# Patient Record
Sex: Female | Born: 1995 | Race: Black or African American | Hispanic: No | Smoking: Never smoker
Health system: Southern US, Community
[De-identification: ages and names within clinical notes are randomized; demographics above are authoritative.]

## PROBLEM LIST (undated history)

## (undated) DIAGNOSIS — F32A Depression, unspecified: Secondary | ICD-10-CM

## (undated) DIAGNOSIS — F329 Major depressive disorder, single episode, unspecified: Secondary | ICD-10-CM

## (undated) HISTORY — DX: Depression, unspecified: F32.A

## (undated) HISTORY — PX: ANTERIOR CRUCIATE LIGAMENT REPAIR: SHX115

## (undated) HISTORY — DX: Major depressive disorder, single episode, unspecified: F32.9

---

## 2016-04-11 NOTE — L&D Delivery Note (Signed)
Delivery Note At 3:18 PM a viable female "Selena Walsh"  was delivered via Vaginal, Spontaneous Delivery (Presentation:OA). Compound presentation of the right arm.  APGAR: 9, 9; weight 7 lb 4.6 oz (3305 g).   Placenta status: intact, spontaneous  Cord:#VC  with the following complications: .  Cord pH: n/a   Anesthesia:  Epidural Episiotomy: None Lacerations: 1st degree;Perineal-repaired, Bilateral labial tears, right extending into the vagina-repaired Suture Repair: 3.0 chromic Est. Blood Loss (mL): 400  During the repair, pt continued to bleed moderately.  Uterus was boggy per RN.  Methergine 0.2 mg IM ordered. I/O of bladder performed with sterile technique.  100-150 ml obtained.  Bleeding adequately controlled.  Mom to postpartum.  Baby to Couplet care / Skin to Skin.  Geryl RankinsVARNADO, Selena Walsh 08/13/2016, 10:39 AM

## 2016-05-13 ENCOUNTER — Other Ambulatory Visit: Payer: Self-pay | Admitting: Obstetrics and Gynecology

## 2016-05-13 DIAGNOSIS — Z3A29 29 weeks gestation of pregnancy: Secondary | ICD-10-CM

## 2016-05-13 DIAGNOSIS — Z3689 Encounter for other specified antenatal screening: Secondary | ICD-10-CM

## 2016-05-13 DIAGNOSIS — O0933 Supervision of pregnancy with insufficient antenatal care, third trimester: Secondary | ICD-10-CM

## 2016-05-13 LAB — OB RESULTS CONSOLE HIV ANTIBODY (ROUTINE TESTING): HIV: NONREACTIVE

## 2016-05-13 LAB — OB RESULTS CONSOLE ABO/RH: RH TYPE: POSITIVE

## 2016-05-13 LAB — OB RESULTS CONSOLE ANTIBODY SCREEN: Antibody Screen: NEGATIVE

## 2016-05-13 LAB — OB RESULTS CONSOLE RUBELLA ANTIBODY, IGM: Rubella: IMMUNE

## 2016-05-13 LAB — OB RESULTS CONSOLE RPR: RPR: NONREACTIVE

## 2016-05-13 LAB — OB RESULTS CONSOLE HEPATITIS B SURFACE ANTIGEN: HEP B S AG: NEGATIVE

## 2016-05-17 ENCOUNTER — Other Ambulatory Visit: Payer: Self-pay | Admitting: Obstetrics and Gynecology

## 2016-05-17 ENCOUNTER — Other Ambulatory Visit (HOSPITAL_COMMUNITY)
Admission: RE | Admit: 2016-05-17 | Discharge: 2016-05-17 | Disposition: A | Discharge status: Home / Self Care | Payer: BLUE CROSS/BLUE SHIELD | Source: Ambulatory Visit | Attending: Obstetrics and Gynecology | Admitting: Obstetrics and Gynecology

## 2016-05-17 DIAGNOSIS — Z01419 Encounter for gynecological examination (general) (routine) without abnormal findings: Secondary | ICD-10-CM | POA: Insufficient documentation

## 2016-05-17 LAB — OB RESULTS CONSOLE GC/CHLAMYDIA
Chlamydia: NEGATIVE
Gonorrhea: NEGATIVE

## 2016-05-18 LAB — CYTOLOGY - PAP: DIAGNOSIS: NEGATIVE

## 2016-05-20 ENCOUNTER — Ambulatory Visit (HOSPITAL_COMMUNITY)
Admission: RE | Admit: 2016-05-20 | Discharge: 2016-05-20 | Disposition: A | Discharge status: Home / Self Care | Payer: BLUE CROSS/BLUE SHIELD | Source: Ambulatory Visit | Attending: Obstetrics and Gynecology | Admitting: Obstetrics and Gynecology

## 2016-05-20 ENCOUNTER — Other Ambulatory Visit: Payer: Self-pay | Admitting: Obstetrics and Gynecology

## 2016-05-20 DIAGNOSIS — Z3A29 29 weeks gestation of pregnancy: Secondary | ICD-10-CM

## 2016-05-20 DIAGNOSIS — Z363 Encounter for antenatal screening for malformations: Secondary | ICD-10-CM | POA: Diagnosis present

## 2016-05-20 DIAGNOSIS — O0933 Supervision of pregnancy with insufficient antenatal care, third trimester: Secondary | ICD-10-CM

## 2016-05-20 DIAGNOSIS — Z3689 Encounter for other specified antenatal screening: Secondary | ICD-10-CM

## 2016-05-20 LAB — OB RESULTS CONSOLE GBS: GBS: POSITIVE

## 2016-06-08 ENCOUNTER — Other Ambulatory Visit: Payer: Self-pay | Admitting: Obstetrics and Gynecology

## 2016-06-08 DIAGNOSIS — Z3A35 35 weeks gestation of pregnancy: Secondary | ICD-10-CM

## 2016-06-08 DIAGNOSIS — O26843 Uterine size-date discrepancy, third trimester: Secondary | ICD-10-CM

## 2016-06-13 ENCOUNTER — Ambulatory Visit (HOSPITAL_COMMUNITY)
Admission: RE | Admit: 2016-06-13 | Discharge: 2016-06-13 | Disposition: A | Discharge status: Home / Self Care | Payer: BLUE CROSS/BLUE SHIELD | Source: Ambulatory Visit | Attending: Obstetrics and Gynecology | Admitting: Obstetrics and Gynecology

## 2016-06-13 DIAGNOSIS — Z3687 Encounter for antenatal screening for uncertain dates: Secondary | ICD-10-CM | POA: Insufficient documentation

## 2016-06-13 DIAGNOSIS — Z3A32 32 weeks gestation of pregnancy: Secondary | ICD-10-CM | POA: Insufficient documentation

## 2016-06-13 DIAGNOSIS — Z3A35 35 weeks gestation of pregnancy: Secondary | ICD-10-CM

## 2016-06-13 DIAGNOSIS — Z362 Encounter for other antenatal screening follow-up: Secondary | ICD-10-CM | POA: Diagnosis present

## 2016-06-13 DIAGNOSIS — O26843 Uterine size-date discrepancy, third trimester: Secondary | ICD-10-CM

## 2016-08-09 ENCOUNTER — Encounter (HOSPITAL_COMMUNITY): Payer: Self-pay | Admitting: *Deleted

## 2016-08-09 ENCOUNTER — Telehealth (HOSPITAL_COMMUNITY): Payer: Self-pay | Admitting: *Deleted

## 2016-08-09 NOTE — Telephone Encounter (Signed)
Preadmission screen  

## 2016-08-11 ENCOUNTER — Other Ambulatory Visit: Payer: Self-pay | Admitting: Obstetrics and Gynecology

## 2016-08-12 ENCOUNTER — Inpatient Hospital Stay (HOSPITAL_COMMUNITY)
Admission: RE | Admit: 2016-08-12 | Discharge: 2016-08-14 | DRG: 774 | Disposition: A | Discharge status: Home / Self Care | Payer: BLUE CROSS/BLUE SHIELD | Source: Ambulatory Visit | Attending: Obstetrics and Gynecology | Admitting: Obstetrics and Gynecology

## 2016-08-12 ENCOUNTER — Inpatient Hospital Stay (HOSPITAL_COMMUNITY): Payer: BLUE CROSS/BLUE SHIELD | Admitting: Anesthesiology

## 2016-08-12 ENCOUNTER — Encounter (HOSPITAL_COMMUNITY): Payer: Self-pay

## 2016-08-12 DIAGNOSIS — O326XX0 Maternal care for compound presentation, not applicable or unspecified: Secondary | ICD-10-CM | POA: Diagnosis present

## 2016-08-12 DIAGNOSIS — F329 Major depressive disorder, single episode, unspecified: Secondary | ICD-10-CM | POA: Diagnosis present

## 2016-08-12 DIAGNOSIS — O480 Post-term pregnancy: Principal | ICD-10-CM | POA: Diagnosis present

## 2016-08-12 DIAGNOSIS — O135 Gestational [pregnancy-induced] hypertension without significant proteinuria, complicating the puerperium: Secondary | ICD-10-CM | POA: Diagnosis present

## 2016-08-12 DIAGNOSIS — O99344 Other mental disorders complicating childbirth: Secondary | ICD-10-CM | POA: Diagnosis present

## 2016-08-12 DIAGNOSIS — O99824 Streptococcus B carrier state complicating childbirth: Secondary | ICD-10-CM | POA: Diagnosis present

## 2016-08-12 DIAGNOSIS — Z8249 Family history of ischemic heart disease and other diseases of the circulatory system: Secondary | ICD-10-CM

## 2016-08-12 DIAGNOSIS — Z3A40 40 weeks gestation of pregnancy: Secondary | ICD-10-CM | POA: Diagnosis not present

## 2016-08-12 DIAGNOSIS — O0993 Supervision of high risk pregnancy, unspecified, third trimester: Secondary | ICD-10-CM

## 2016-08-12 LAB — CBC
HCT: 32.9 % — ABNORMAL LOW (ref 36.0–46.0)
HEMATOCRIT: 35.4 % — AB (ref 36.0–46.0)
HEMOGLOBIN: 11.4 g/dL — AB (ref 12.0–15.0)
Hemoglobin: 12.2 g/dL (ref 12.0–15.0)
MCH: 29.8 pg (ref 26.0–34.0)
MCH: 29.6 pg (ref 26.0–34.0)
MCHC: 34.7 g/dL (ref 30.0–36.0)
MCHC: 34.5 g/dL (ref 30.0–36.0)
MCV: 85.9 fL (ref 78.0–100.0)
MCV: 85.9 fL (ref 78.0–100.0)
Platelets: 285 10*3/uL (ref 150–400)
Platelets: 273 10*3/uL (ref 150–400)
RBC: 3.83 MIL/uL — AB (ref 3.87–5.11)
RBC: 4.12 MIL/uL (ref 3.87–5.11)
RDW: 13.5 % (ref 11.5–15.5)
RDW: 13.5 % (ref 11.5–15.5)
WBC: 9.8 10*3/uL (ref 4.0–10.5)
WBC: 10.2 10*3/uL (ref 4.0–10.5)

## 2016-08-12 LAB — COMPREHENSIVE METABOLIC PANEL
ALBUMIN: 3.1 g/dL — AB (ref 3.5–5.0)
ALT: 13 U/L — ABNORMAL LOW (ref 14–54)
ANION GAP: 10 (ref 5–15)
AST: 22 U/L (ref 15–41)
Alkaline Phosphatase: 146 U/L — ABNORMAL HIGH (ref 38–126)
BILIRUBIN TOTAL: 0.5 mg/dL (ref 0.3–1.2)
BUN: 6 mg/dL (ref 6–20)
CALCIUM: 9.1 mg/dL (ref 8.9–10.3)
CO2: 21 mmol/L — ABNORMAL LOW (ref 22–32)
Chloride: 104 mmol/L (ref 101–111)
Creatinine, Ser: 0.55 mg/dL (ref 0.44–1.00)
Glucose, Bld: 68 mg/dL (ref 65–99)
POTASSIUM: 3.6 mmol/L (ref 3.5–5.1)
Sodium: 135 mmol/L (ref 135–145)
TOTAL PROTEIN: 7.1 g/dL (ref 6.5–8.1)

## 2016-08-12 LAB — PROTEIN / CREATININE RATIO, URINE
CREATININE, URINE: 30 mg/dL
PROTEIN CREATININE RATIO: 0.23 mg/mg{creat} — AB (ref 0.00–0.15)
TOTAL PROTEIN, URINE: 7 mg/dL

## 2016-08-12 LAB — TYPE AND SCREEN
ABO/RH(D): B POS
Antibody Screen: NEGATIVE

## 2016-08-12 LAB — ABO/RH: ABO/RH(D): B POS

## 2016-08-12 LAB — RPR: RPR: NONREACTIVE

## 2016-08-12 MED ORDER — METHYLERGONOVINE MALEATE 0.2 MG PO TABS: 0.2000 mg | ORAL_TABLET | ORAL | Status: DC | PRN

## 2016-08-12 MED ORDER — METHYLERGONOVINE MALEATE 0.2 MG/ML IJ SOLN
0.2000 mg | Freq: Once | INTRAMUSCULAR | Status: AC
  Administered 2016-08-12: 0.2 mg via INTRAMUSCULAR

## 2016-08-12 MED ORDER — LIDOCAINE HCL (PF) 1 % IJ SOLN
30.0000 mL | INTRAMUSCULAR | Status: DC | PRN
  Filled 2016-08-12: qty 30

## 2016-08-12 MED ORDER — ONDANSETRON HCL 4 MG/2ML IJ SOLN: 4.0000 mg | INTRAMUSCULAR | Status: DC | PRN

## 2016-08-12 MED ORDER — LACTATED RINGERS IV SOLN
500.0000 mL | INTRAVENOUS | Status: DC | PRN
  Administered 2016-08-12: 500 mL via INTRAVENOUS

## 2016-08-12 MED ORDER — PENICILLIN G POT IN DEXTROSE 60000 UNIT/ML IV SOLN
3.0000 10*6.[IU] | INTRAVENOUS | Status: DC
  Administered 2016-08-12 (×3): 3 10*6.[IU] via INTRAVENOUS
  Filled 2016-08-12 (×6): qty 50

## 2016-08-12 MED ORDER — PHENYLEPHRINE 40 MCG/ML (10ML) SYRINGE FOR IV PUSH (FOR BLOOD PRESSURE SUPPORT)
80.0000 ug | PREFILLED_SYRINGE | INTRAVENOUS | Status: DC | PRN
  Filled 2016-08-12: qty 5

## 2016-08-12 MED ORDER — SOD CITRATE-CITRIC ACID 500-334 MG/5ML PO SOLN: 30.0000 mL | ORAL | Status: DC | PRN

## 2016-08-12 MED ORDER — SENNOSIDES-DOCUSATE SODIUM 8.6-50 MG PO TABS
2.0000 | ORAL_TABLET | ORAL | Status: DC
  Administered 2016-08-13 – 2016-08-14 (×2): 2 via ORAL
  Filled 2016-08-12 (×2): qty 2

## 2016-08-12 MED ORDER — METHYLERGONOVINE MALEATE 0.2 MG/ML IJ SOLN
INTRAMUSCULAR | Status: AC
  Filled 2016-08-12: qty 1

## 2016-08-12 MED ORDER — OXYCODONE-ACETAMINOPHEN 5-325 MG PO TABS: 1.0000 | ORAL_TABLET | ORAL | Status: DC | PRN

## 2016-08-12 MED ORDER — MAGNESIUM SULFATE BOLUS VIA INFUSION
4.0000 g | Freq: Once | INTRAVENOUS | Status: AC
  Administered 2016-08-12: 4 g via INTRAVENOUS
  Filled 2016-08-12: qty 500

## 2016-08-12 MED ORDER — OXYTOCIN 40 UNITS IN LACTATED RINGERS INFUSION - SIMPLE MED: 2.5000 [IU]/h | INTRAVENOUS | Status: DC | PRN

## 2016-08-12 MED ORDER — EPHEDRINE 5 MG/ML INJ
10.0000 mg | INTRAVENOUS | Status: DC | PRN
  Filled 2016-08-12: qty 2

## 2016-08-12 MED ORDER — PHENYLEPHRINE 40 MCG/ML (10ML) SYRINGE FOR IV PUSH (FOR BLOOD PRESSURE SUPPORT)
80.0000 ug | PREFILLED_SYRINGE | INTRAVENOUS | Status: DC | PRN
  Filled 2016-08-12: qty 10
  Filled 2016-08-12: qty 5

## 2016-08-12 MED ORDER — ACETAMINOPHEN 325 MG PO TABS: 650.0000 mg | ORAL_TABLET | ORAL | Status: DC | PRN

## 2016-08-12 MED ORDER — DIPHENHYDRAMINE HCL 25 MG PO CAPS: 25.0000 mg | ORAL_CAPSULE | Freq: Four times a day (QID) | ORAL | Status: DC | PRN

## 2016-08-12 MED ORDER — BENZOCAINE-MENTHOL 20-0.5 % EX AERO
1.0000 "application " | INHALATION_SPRAY | CUTANEOUS | Status: DC | PRN
  Administered 2016-08-13: 1 via TOPICAL
  Filled 2016-08-12: qty 56

## 2016-08-12 MED ORDER — ZOLPIDEM TARTRATE 5 MG PO TABS: 5.0000 mg | ORAL_TABLET | Freq: Every evening | ORAL | Status: DC | PRN

## 2016-08-12 MED ORDER — BUTORPHANOL TARTRATE 1 MG/ML IJ SOLN
1.0000 mg | INTRAMUSCULAR | Status: DC | PRN
  Administered 2016-08-12 (×2): 1 mg via INTRAVENOUS
  Filled 2016-08-12 (×2): qty 1

## 2016-08-12 MED ORDER — LABETALOL HCL 5 MG/ML IV SOLN: 20.0000 mg | INTRAVENOUS | Status: DC | PRN

## 2016-08-12 MED ORDER — LACTATED RINGERS IV SOLN: 500.0000 mL | Freq: Once | INTRAVENOUS | Status: DC

## 2016-08-12 MED ORDER — IBUPROFEN 600 MG PO TABS
600.0000 mg | ORAL_TABLET | Freq: Four times a day (QID) | ORAL | Status: DC
  Administered 2016-08-12 – 2016-08-14 (×8): 600 mg via ORAL
  Filled 2016-08-12 (×9): qty 1

## 2016-08-12 MED ORDER — PENICILLIN G POTASSIUM 5000000 UNITS IJ SOLR
5.0000 10*6.[IU] | Freq: Once | INTRAVENOUS | Status: AC
  Administered 2016-08-12: 5 10*6.[IU] via INTRAVENOUS
  Filled 2016-08-12: qty 5

## 2016-08-12 MED ORDER — MAGNESIUM HYDROXIDE 400 MG/5ML PO SUSP: 30.0000 mL | ORAL | Status: DC | PRN

## 2016-08-12 MED ORDER — FENTANYL 2.5 MCG/ML BUPIVACAINE 1/10 % EPIDURAL INFUSION (WH - ANES)
14.0000 mL/h | INTRAMUSCULAR | Status: DC | PRN
  Administered 2016-08-12: 14 mL/h via EPIDURAL
  Filled 2016-08-12: qty 100

## 2016-08-12 MED ORDER — MAGNESIUM SULFATE 40 G IN LACTATED RINGERS - SIMPLE
2.0000 g/h | INTRAVENOUS | Status: DC
  Filled 2016-08-12 (×2): qty 500

## 2016-08-12 MED ORDER — DIPHENHYDRAMINE HCL 50 MG/ML IJ SOLN: 12.5000 mg | INTRAMUSCULAR | Status: DC | PRN

## 2016-08-12 MED ORDER — LIDOCAINE HCL (PF) 1 % IJ SOLN
INTRAMUSCULAR | Status: DC | PRN
  Administered 2016-08-12 (×2): 5 mL

## 2016-08-12 MED ORDER — LACTATED RINGERS IV SOLN
INTRAVENOUS | Status: DC
  Administered 2016-08-12 (×2): 125 mL/h via INTRAVENOUS
  Administered 2016-08-12: 01:00:00 via INTRAVENOUS

## 2016-08-12 MED ORDER — SIMETHICONE 80 MG PO CHEW
80.0000 mg | CHEWABLE_TABLET | ORAL | Status: DC | PRN
  Administered 2016-08-13: 80 mg via ORAL
  Filled 2016-08-12: qty 1

## 2016-08-12 MED ORDER — OXYTOCIN BOLUS FROM INFUSION
500.0000 mL | Freq: Once | INTRAVENOUS | Status: AC
  Administered 2016-08-12: 500 mL via INTRAVENOUS

## 2016-08-12 MED ORDER — ONDANSETRON HCL 4 MG PO TABS: 4.0000 mg | ORAL_TABLET | ORAL | Status: DC | PRN

## 2016-08-12 MED ORDER — DIBUCAINE 1 % RE OINT
1.0000 | TOPICAL_OINTMENT | RECTAL | Status: DC | PRN
Start: 2016-08-12 — End: 2016-08-14

## 2016-08-12 MED ORDER — PRENATAL MULTIVITAMIN CH
1.0000 | ORAL_TABLET | Freq: Every day | ORAL | Status: DC
  Administered 2016-08-13 – 2016-08-14 (×2): 1 via ORAL
  Filled 2016-08-12 (×3): qty 1

## 2016-08-12 MED ORDER — OXYCODONE-ACETAMINOPHEN 5-325 MG PO TABS: 2.0000 | ORAL_TABLET | ORAL | Status: DC | PRN

## 2016-08-12 MED ORDER — COCONUT OIL OIL: 1.0000 "application " | TOPICAL_OIL | Status: DC | PRN

## 2016-08-12 MED ORDER — OXYCODONE-ACETAMINOPHEN 5-325 MG PO TABS
1.0000 | ORAL_TABLET | ORAL | Status: DC | PRN
  Administered 2016-08-12: 1 via ORAL
  Filled 2016-08-12: qty 1

## 2016-08-12 MED ORDER — OXYTOCIN 40 UNITS IN LACTATED RINGERS INFUSION - SIMPLE MED
2.5000 [IU]/h | INTRAVENOUS | Status: DC
  Filled 2016-08-12: qty 1000

## 2016-08-12 MED ORDER — METHYLERGONOVINE MALEATE 0.2 MG/ML IJ SOLN: 0.2000 mg | INTRAMUSCULAR | Status: DC | PRN

## 2016-08-12 MED ORDER — OXYTOCIN 40 UNITS IN LACTATED RINGERS INFUSION - SIMPLE MED
1.0000 m[IU]/min | INTRAVENOUS | Status: DC
  Administered 2016-08-12: 1 m[IU]/min via INTRAVENOUS

## 2016-08-12 MED ORDER — TERBUTALINE SULFATE 1 MG/ML IJ SOLN: 0.2500 mg | Freq: Once | INTRAMUSCULAR | Status: DC | PRN

## 2016-08-12 MED ORDER — WITCH HAZEL-GLYCERIN EX PADS: 1.0000 "application " | MEDICATED_PAD | CUTANEOUS | Status: DC | PRN

## 2016-08-12 MED ORDER — HYDROXYZINE HCL 50 MG PO TABS
50.0000 mg | ORAL_TABLET | Freq: Four times a day (QID) | ORAL | Status: DC | PRN
Start: 2016-08-12 — End: 2016-08-12
  Filled 2016-08-12: qty 1

## 2016-08-12 MED ORDER — TETANUS-DIPHTH-ACELL PERTUSSIS 5-2.5-18.5 LF-MCG/0.5 IM SUSP: 0.5000 mL | Freq: Once | INTRAMUSCULAR | Status: DC

## 2016-08-12 MED ORDER — MISOPROSTOL 25 MCG QUARTER TABLET
25.0000 ug | ORAL_TABLET | ORAL | Status: DC
  Administered 2016-08-12 (×2): 25 ug via VAGINAL
  Filled 2016-08-12 (×6): qty 1

## 2016-08-12 MED ORDER — LACTATED RINGERS IV SOLN
INTRAVENOUS | Status: DC
  Administered 2016-08-13 (×2): via INTRAVENOUS

## 2016-08-12 MED ORDER — ONDANSETRON HCL 4 MG/2ML IJ SOLN: 4.0000 mg | Freq: Four times a day (QID) | INTRAMUSCULAR | Status: DC | PRN

## 2016-08-12 MED ORDER — HYDRALAZINE HCL 20 MG/ML IJ SOLN: 5.0000 mg | INTRAMUSCULAR | Status: DC | PRN

## 2016-08-12 NOTE — Progress Notes (Signed)
Selena Walsh is a 21 y.o. G1P0 at 5347w1d   Subjective: Pt feeling contractions.  Almost ready for pain medications.  Objective: BP (!) 146/98 (BP Location: Right Arm)   Pulse (!) 58   Temp 98.7 F (37.1 C) (Oral)   Resp 18   Ht 6' (1.829 m)   Wt 82.6 kg (182 lb)   LMP 10/29/2015   BMI 24.68 kg/m  No intake/output data recorded. No intake/output data recorded.  FHT:  FHR: 130s bpm, variability: moderate,  accelerations:  Present,  decelerations:  Absent UC:   regular, every 1-2 minutes SVE:   Dilation: 2 Effacement (%): 80 Station: 0, +1 Exam by:: MD Varnardo  Labs: Lab Results  Component Value Date   WBC 9.8 08/12/2016   HGB 11.4 (L) 08/12/2016   HCT 32.9 (L) 08/12/2016   MCV 85.9 08/12/2016   PLT 285 08/12/2016    Assessment / Plan: IUP @ 41 1/7 weeks IOL due to postdates.  Labor: Progressing normally and S/p 2 Cytotec. Contractions regular.  Monitor for hyperstimulation. Preeclampsia:  BP normal. Fetal Wellbeing:  Category I Pain Control:  Epidural, IV pain meds and Nitrous Oxide ordered. I/D:  GBS +.  S/p PCN x 2 doses. Anticipated MOD:  NSVD  Selena Walsh 08/12/2016, 7:42 AM

## 2016-08-12 NOTE — Progress Notes (Signed)
Patient ID: Selena Walsh, female   DOB: [birth year], 21 y.o.   MRN: [id number] Pt comfortable s/p epidural.  Prefers laying on left side. VSS Gen:  NAD, comfortable Cervix 3-4/80/0+1, good descent with contractions. Cat I tracing.  Decreased variability  At times.  S/p Stadol X 2.  Contractions couplets then some spacing x 4-5 minutes AROM clear fluid.  IUPC placed to monitor contractions despite pt positioning. Pitocin infusion at 3 mu  A/P Postdates.   IOL progressing. Monitor MVUs.  Titrate Pitocin prn. GBS+.  S/p PNC x 2+ doses.

## 2016-08-12 NOTE — Anesthesia Preprocedure Evaluation (Signed)

## 2016-08-12 NOTE — Progress Notes (Signed)
Pt evaluated at ~ 6pm Notified by RN that pt stated she was not feeling well, felt dizzy.  SBP 170s, slowly trended down to 140/80s.  Reflexes 2+. Gen:  Pt appears normal but tired. CV:  RRR Lungs:  CTA bilaterally Abd:  No RUQ pain, uterus firm.  CBC, CMP normal.  Hg 11.4 to 12.2  A/P  s/p SVD Hypertension.  Pt not in pain.  Start Magnesium for Preeclampsia.F/u PCR. Pt counseled on diagnosis.   Continue to observe closely.

## 2016-08-12 NOTE — H&P (Signed)
Selena Walsh is a 21 y.o. female G1 @ 40 5/7 weeks c/w 28 week ultrasound presenting for ROB.  Recommend postdates induction.. [facility] (Dr. Dion BodyVarnado) at 28  Weeks.  Pt was in denial about pregnancy and did not seek care until the last trimester.  Pt denied any issues prior to presenting for prenatal care.   OB History    Gravida Para Term Preterm AB Living   1             SAB TAB Ectopic Multiple Live Births                 Past Medical History:  Diagnosis Date  . Depression    Past Surgical History:  Procedure Laterality Date  . ANTERIOR CRUCIATE LIGAMENT REPAIR     Family History: family history includes Anemia in her mother; Asthma in her brother; Heart disease in her maternal grandmother; Hypertension in her father and mother; Seizures in her father. Social History:  reports that she has never smoked. She has never used smokeless tobacco. She reports that she does not drink alcohol or use drugs.     Maternal Diabetes: No Genetic Screening: Declined Maternal Ultrasounds/Referrals: Normal Fetal Ultrasounds or other Referrals:  None Maternal Substance Abuse:  No Significant Maternal Medications:  None Significant Maternal Lab Results:  Lab values include: Group B Strep positive Other Comments:  None  Review of Systems  Constitutional: Negative for chills and fever.  Gastrointestinal: Negative for abdominal pain.   Maternal Medical History:  Contractions: Onset was 1 week or more ago.   Frequency: irregular.   Perceived severity is moderate.    Fetal activity: Perceived fetal activity is normal.    Prenatal complications: Late prenatal care  Prenatal Complications - Diabetes: none.      Last menstrual period 10/29/2015. Maternal Exam:  Abdomen: Patient reports no abdominal tenderness. Estimated fetal weight is 6.5-7 pounds.   Fetal presentation: vertex  Introitus: Normal vulva. Normal vagina.  Pelvis: adequate for delivery.    Cervix: Cervix evaluated by digital exam.     Fetal Exam Fetal Monitor Review: Mode: hand-held doppler probe.   Baseline rate: 140s.      Physical Exam  Constitutional: She is oriented to person, place, and time. She appears well-developed and well-nourished. No distress.  HENT:  Head: Normocephalic and atraumatic.  Eyes: EOM are normal.  Neck: Normal range of motion.  Respiratory: Effort normal and breath sounds normal.  GI: There is no tenderness.  Musculoskeletal: Normal range of motion. She exhibits no edema or tenderness.  Neurological: She is alert and oriented to person, place, and time.  Skin: Skin is warm and dry.  Psychiatric: She has a normal mood and affect.    Cervix 1/80/-2  Prenatal labs: ABO, Rh: B/Positive/-- (02/02 0000) Antibody: Negative (02/02 0000) Rubella: Immune (02/02 0000) RPR: Nonreactive (02/02 0000)  HBsAg: Negative (02/02 0000)  HIV: Non-reactive (02/02 0000)  GBS:   Positive  Assessment/Plan: IUP @ 40 5/7 weeks IOL for postdates GBS positive. Late prenatal care.  IOL with Cytotec on admission.  Pitocin at 2 cm. PCN with SROM or labor. Risks and benefits of induction reviewed.    Regis Wiland 08/12/2016, 12:07 AM

## 2016-08-12 NOTE — Anesthesia Pain Management Evaluation Note (Signed)
  CRNA Pain Management Visit Note  Patient: Selena Walsh, 21 y.o., female  "Hello I am a member of the anesthesia team at [facility]. We have an anesthesia team available at all times to provide care throughout the hospital, including epidural management and anesthesia for C-section. I don't know your plan for the delivery whether it a natural birth, water birth, IV sedation, nitrous supplementation, doula or epidural, but we want to meet your pain goals."   1.Was your pain managed to your expectations on prior hospitalizations?   No prior hospitalizations  2.What is your expectation for pain management during this hospitalization?     Epidural  3.How can we help you reach that goal? epidural  Record the patient's initial score and the patient's pain goal.   Pain: 2  Pain Goal: 4 [facility] wants you to be able to say your pain was always managed very well.  Selena Walsh 08/12/2016

## 2016-08-12 NOTE — Anesthesia Procedure Notes (Signed)
Epidural Patient location during procedure: OB  Staffing Anesthesiologist: Cecelia Graciano Performed: anesthesiologist   Preanesthetic Checklist Completed: patient identified, site marked, surgical consent, pre-op evaluation, timeout performed, IV checked, risks and benefits discussed and monitors and equipment checked  Epidural Patient position: sitting Prep: DuraPrep Patient monitoring: heart rate, continuous pulse ox and blood pressure Approach: midline Location: L3-L4 Injection technique: LOR saline  Needle:  Needle type: Tuohy  Needle gauge: 17 G Needle length: 9 cm and 9 Needle insertion depth: 6 cm Catheter type: closed end flexible Catheter size: 20 Guage Catheter at skin depth: 10 cm Test dose: negative  Assessment Events: blood not aspirated, injection not painful, no injection resistance, negative IV test and no paresthesia  Additional Notes Patient identified. Risks/Benefits/Options discussed with patient including but not limited to bleeding, infection, nerve damage, paralysis, failed block, incomplete pain control, headache, blood pressure changes, nausea, vomiting, reactions to medication both or allergic, itching and postpartum back pain. Confirmed with bedside nurse the patient's most recent platelet count. Confirmed with patient that they are not currently taking any anticoagulation, have any bleeding history or any family history of bleeding disorders. Patient expressed understanding and wished to proceed. All questions were answered. Sterile technique was used throughout the entire procedure. Please see nursing notes for vital signs. Test dose was given through epidural needle and negative prior to continuing to dose epidural or start infusion. Warning signs of high block given to the patient including shortness of breath, tingling/numbness in hands, complete motor block, or any concerning symptoms with instructions to call for help. Patient was given instructions  on fall risk and not to get out of bed. All questions and concerns addressed with instructions to call with any issues.     

## 2016-08-12 NOTE — Lactation Note (Signed)
This note was copied from a baby's chart. Lactation Consultation Note  Patient Name: Selena Walsh Today's Date: 08/12/2016 Reason for consult: Initial assessment Baby at 1 hr of life. Upon entry baby was sts and trying to latch. Mom has large breast and needs support pillows. Dad is at the bedside and supportive. Discussed baby behavior, feeding frequency, baby belly size, voids, wt loss, breast changes, and nipple care. Demonstrated manual expression, colostrum noted bilaterally. Discussed spoon feeding. Given lactation handouts. Aware of OP services and support group.  Mom will offer the breast on demand 8+/24hr, express, and spoon feed as needed per volume guidelines.      Maternal Data Has patient been taught Hand Expression?: Yes Does the patient have breastfeeding experience prior to this delivery?: No  Feeding Feeding Type: Breast Fed Length of feed: 30 min  LATCH Score/Interventions Latch: Grasps breast easily, tongue down, lips flanged, rhythmical sucking.  Audible Swallowing: A few with stimulation Intervention(s): Skin to skin  Type of Nipple: Everted at rest and after stimulation  Comfort (Breast/Nipple): Soft / non-tender     Hold (Positioning): Full assist, staff holds infant at breast Intervention(s): Position options;Support Pillows  LATCH Score: 7  Lactation Tools Discussed/Used WIC Program: No   Consult Status Consult Status: Follow-up Date: 08/13/16 Follow-up type: In-patient    Rulon Eisenmengerlizabeth E Curtiss Mahmood 08/12/2016, 4:47 PM

## 2016-08-13 LAB — CBC
HEMATOCRIT: 29.3 % — AB (ref 36.0–46.0)
Hemoglobin: 10.2 g/dL — ABNORMAL LOW (ref 12.0–15.0)
MCH: 29.9 pg (ref 26.0–34.0)
MCHC: 34.8 g/dL (ref 30.0–36.0)
MCV: 85.9 fL (ref 78.0–100.0)
Platelets: 219 10*3/uL (ref 150–400)
RBC: 3.41 MIL/uL — AB (ref 3.87–5.11)
RDW: 13.5 % (ref 11.5–15.5)
WBC: 11.5 10*3/uL — AB (ref 4.0–10.5)

## 2016-08-13 NOTE — Lactation Note (Signed)
This note was copied from a baby's chart. Lactation Consultation Note  Patient Name: Selena Walsh ZOXWR'UToday's Date: 08/13/2016 Reason for consult: Follow-up assessment;Infant weight loss Baby is 20 hours old.  Per mom the baby was sleepy last night , but recently fed at 1057 for 10 mins.  While LC present baby sucking on her fingers and showing feeding cues, LC offered to assist and mom accepted. LC checked diaper . Noted to be dry ( cotton balls in place ). And not stool.  Started on the right breast / football/ reviewed hand expressing with few small drops of colostrum. Baby opened wide and latched with depth/ fed 10 mins / few swallows. Baby falling asleep, and released suction.  Baby woke back up and LC assisted to switch to the left breast/ modified football with baby on her side due to large breast/ depth achieved / swallows / and a good sustaining pattern. Baby fed 10 mins/ released on her own and after finishing/ acting still hungry / LC assisted to latch on the same breast / football/ depth achieved and more swallows noted/ baby still feeding and noted time. RN aware to document completed time.  Per mom nipples alittle tender/ LC did not notice any breakdown on either nipple and compressible areolas. LC mentioned to mom having compressible areolas makes it easier for the baby to sustain latch.  Mom and dad receptive to teaching.   Maternal Data Has patient been taught Hand Expression?: Yes  Feeding Feeding Type: Breast Fed Length of feed:  (swallows noted )  LATCH Score/Interventions Latch: Repeated attempts needed to sustain latch, nipple held in mouth throughout feeding, stimulation needed to elicit sucking reflex. Intervention(s): Assist with latch;Adjust position;Breast massage;Breast compression  Audible Swallowing: Spontaneous and intermittent  Type of Nipple: Everted at rest and after stimulation  Comfort (Breast/Nipple): Soft / non-tender     Hold (Positioning):  Assistance needed to correctly position infant at breast and maintain latch. Intervention(s): Breastfeeding basics reviewed;Support Pillows;Position options;Skin to skin  LATCH Score: 8  Lactation Tools Discussed/Used     Consult Status Consult Status: Follow-up Date: 08/14/16 Follow-up type: In-patient    Matilde SprangMargaret Ann Khalik Pewitt 08/13/2016, 12:49 PM

## 2016-08-13 NOTE — Progress Notes (Signed)
Postpartum day #1, NSVD  Subjective Pt without complaints.  Lochia normal.  Pain controlled.  Breast feeding yes Denies headaches, visual changes.  Just tired.   Temp:  [98 F (36.7 C)-99.6 F (37.6 C)] 98 F (36.7 C) (05/05 0810) Pulse Rate:  [42-102] 42 (05/05 0810) Resp:  [16-18] 16 (05/05 0810) BP: (90-173)/(42-135) 121/53 (05/05 0810) SpO2:  [99 %-100 %] 100 % (05/05 0810)  Gen:  NAD, A&O x 3, speech normal, does not appear somnolent Uterine fundus:  Firm, nontender Lochia normal Ext:  Minimal Edema, no calf tenderness bilaterally  CBC    Component Value Date/Time   WBC 11.5 (H) 08/13/2016 0517   RBC 3.41 (L) 08/13/2016 0517   HGB 10.2 (L) 08/13/2016 0517   HCT 29.3 (L) 08/13/2016 0517   PLT 219 08/13/2016 0517   MCV 85.9 08/13/2016 0517   MCH 29.9 08/13/2016 0517   MCHC 34.8 08/13/2016 0517   RDW 13.5 08/13/2016 0517   PCR 0.26  A/P: S/p SVD doing well. HTN postpartum  Magnesium started.  PCR less than 0.3.  Discontinue Magnesium.  Monitor BP closely. Routine postpartum care. Lactation support. Discharge late this evening or in am.  Pt informed BP needs to be normal.  Selena Walsh 08/13/2016, 10:44 AM

## 2016-08-14 MED ORDER — IBUPROFEN 600 MG PO TABS
600.0000 mg | ORAL_TABLET | Freq: Four times a day (QID) | ORAL | 0 refills | Status: AC
Start: 2016-08-14 — End: ?

## 2016-08-14 NOTE — Progress Notes (Signed)
Psychosocial assessment completed.  No barriers to discharge.  Full documentation to follow. 

## 2016-08-14 NOTE — Discharge Summary (Signed)
OB Discharge Summary     Patient Name: Selena Walsh DOB: Sep 25, 1995 MRN: [id number]  Date of admission: 08/12/2016 Delivering MD: Geryl Rankins   Date of discharge: 08/14/2016  Admitting diagnosis: INDUCTION Intrauterine pregnancy: [redacted]w[redacted]d     Secondary diagnosis:  Active Problems:   Supervision of high-risk pregnancy, third trimester  Additional problems: Late prenatal care, h/o depression     Discharge diagnosis: Term Pregnancy Delivered and Gestational Hypertension                                                                                                Post partum procedures:Magnesium sulfate  Augmentation: AROM, Pitocin and Cytotec  Complications: None  Hospital course:  Induction of Labor With Vaginal Delivery   21 y.o. yo G1P1001 at [redacted]w[redacted]d was admitted to the hospital 08/12/2016 for induction of labor.  Indication for induction: Postdates.  Patient had an uncomplicated labor course as follows: Membrane Rupture Time/Date: 1:11 PM ,08/12/2016   Intrapartum Procedures: Episiotomy: None [1]                                         Lacerations:  1st degree [2];Perineal [11]  Patient had delivery of a Viable infant.  Information for the patient's newborn:  Maryna, Yeagle [[id number]]  Delivery Method: Vag-Spont   1-2 hours postop BP increased to 170/80s.  Magnesium started.  PCR was 0.2.  BPs normal to slightly elevated pp.  08/12/2016  Details of delivery can be found in separate delivery note.  Patient had a routine postpartum course. Patient is discharged home 08/14/16.  Physical exam  Vitals:   08/13/16 2014 08/14/16 0020 08/14/16 0420 08/14/16 0830  BP: 122/64 132/60 (!) 125/55 (!) 141/60  Pulse: 78 72 60 (!) 46  Resp: 18 16 20 16   Temp: 98.2 F (36.8 C) 98 F (36.7 C) 98.5 F (36.9 C) 98.4 F (36.9 C)  TempSrc: Oral Oral Oral Oral  SpO2: 100% 100% 99% 100%  Weight:      Height:        General: alert, cooperative and no distress Lochia:  appropriate Uterine Fundus: firm Incision: N/A DVT Evaluation: No evidence of DVT seen on physical exam. Calf/Ankle edema is present Labs: Lab Results  Component Value Date   WBC 11.5 (H) 08/13/2016   HGB 10.2 (L) 08/13/2016   HCT 29.3 (L) 08/13/2016   MCV 85.9 08/13/2016   PLT 219 08/13/2016   CMP Latest Ref Rng & Units 08/12/2016  Glucose 65 - 99 mg/dL 68  BUN 6 - 20 mg/dL 6  Creatinine 7.82 - 9.56 mg/dL 2.13  Sodium 086 - 578 mmol/L 135  Potassium 3.5 - 5.1 mmol/L 3.6  Chloride 101 - 111 mmol/L 104  CO2 22 - 32 mmol/L 21(L)  Calcium 8.9 - 10.3 mg/dL 9.1  Total Protein 6.5 - 8.1 g/dL 7.1  Total Bilirubin 0.3 - 1.2 mg/dL 0.5  Alkaline Phos 38 - 126 U/L 146(H)  AST 15 - 41 U/L 22  ALT 14 -  54 U/L 13(L)    Discharge instruction: per After Visit Summary and "Baby and Me Booklet".  After visit meds:  Allergies as of 08/14/2016   No Known Allergies     Medication List    TAKE these medications   ibuprofen 600 MG tablet Commonly known as:  ADVIL,MOTRIN Take 1 tablet (600 mg total) by mouth every 6 (six) hours.   prenatal multivitamin Tabs tablet Take 1 tablet by mouth daily at 12 noon.       Diet: routine diet  Activity: Advance as tolerated. Pelvic rest for 6 weeks.   Outpatient follow up:1 week bp check, 2 weeks PPD screen Follow up Appt:No future appointments. Follow up Visit:No Follow-up on file.  Postpartum contraception: Abstinence  Newborn Data: Live born female  Birth Weight: 7 lb 4.6 oz (3305 g) APGAR: 9, 9  Baby Feeding: Breast Disposition:home with mother   08/14/2016 Geryl RankinsVARNADO, Tosca Pletz, MD

## 2016-08-14 NOTE — Progress Notes (Signed)
CSW attempted to meet with MOB to complete assessment for hx of Anxiety, but she was working with lactation at this time.  Visitor in room states they will not be discharged until later this afternoon, so CSW offered to return at a later time. 

## 2016-08-14 NOTE — Lactation Note (Signed)
This note was copied from a baby's chart. Lactation Consultation Note  Patient Name: Selena Freddie BreechFaith Waggoner ONGEX'BToday's Date: 08/14/2016 Reason for consult: Follow-up assessment Baby at 42 hr of life. Mom desires to go home today. Mom is reporting bilateral nipple soreness, no skin break down noted. Mom has large breast and is letting baby lay under the wt of the breast and just latch to the tip of the nipple. Used pillows to bring the breast up so that mom can see the nipple and demonstrated how to bring baby in close for a deep latch. Mom reports this feeding "feels better". Upon entry the baby was in MGM arms sucking a pacifier and mom was resting in the bed. When the pacifier was removed from baby's mouth, baby was showing hunger cues. Explained the risks of artifical nipples during this time frame and encouraged mom to put baby to breast at every cue. Baby was able to latch deeply but was sleepy at the breast. Showed mom how to stimulate baby and do breast compressions. Demonstrated manual expression, large drops of colostrum noted bilaterally, given spoons and a Harmony to take home. Mom will offer the breast q3hr on demand, post express, and offer expressed milk per volume guidelines with a cup or spoon. Reviewed spoon and cup feeding. Mom [facility] She was offered rental DEBP from the hospital but she declined. Mom is aware of lactation services and support group. She will call as needed after D/C.     Maternal Data    Feeding Feeding Type: Breast Fed Length of feed: 20 min  LATCH Score/Interventions Latch: Repeated attempts needed to sustain latch, nipple held in mouth throughout feeding, stimulation needed to elicit sucking reflex. Intervention(s): Adjust position;Assist with latch;Breast massage;Breast compression  Audible Swallowing: A few with stimulation Intervention(s): Skin to skin;Hand expression  Type of Nipple: Everted at rest and after  stimulation  Comfort (Breast/Nipple): Filling, red/small blisters or bruises, mild/mod discomfort  Problem noted: Mild/Moderate discomfort Interventions (Mild/moderate discomfort): Hand massage;Hand expression  Hold (Positioning): Assistance needed to correctly position infant at breast and maintain latch. Intervention(s): Position options;Support Pillows  LATCH Score: 6  Lactation Tools Discussed/Used Pump Review: Setup, frequency, and cleaning;Milk Storage Initiated by:: ES Date initiated:: 08/14/16   Consult Status Consult Status: Follow-up Date: 08/15/16 Follow-up type: In-patient    Selena Walsh 08/14/2016, 9:43 AM

## 2016-08-14 NOTE — Clinical Social Work Maternal (Signed)
CLINICAL SOCIAL WORK MATERNAL/CHILD NOTE  Patient Details  Name: Selena Walsh MRN: [id number] Date of Birth: 06/17/1995  Date:  08/14/2016  Clinical Social Worker Initiating Note:  Tashauna Caisse, LCSW Date/ Time Initiated:  08/14/16/1330     Child's Name:  Aniyah Campbell   Legal Guardian:  Other (Comment) (Parents: Lakeysha Flom and Wayne Campbell)   Need for Interpreter:  None   Date of Referral:  08/14/16     Reason for Referral:  Late or No Prenatal Care , Other (Comment) (Hx of Depression)   Referral Source:  [facility]   Address:  [address], [address] [place], [place] [address]  Phone number:  [phone]   Household Members:  Parents   Natural Supports (not living in the home):  Spouse/significant other, Friends, Immediate Family, Extended Family   Professional Supports: None   Employment: Student   Type of Work: 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   Education:  Attending college   Financial Resources:  Private Insurance   Other Resources:      Cultural/Religious Considerations Which May Impact Care: None stated.  Strengths:  Ability to meet basic needs , Home prepared for child , Pediatrician chosen  ([facility])   Risk Factors/Current Problems:  None   Cognitive State:  Able to Concentrate , Insightful , Linear Thinking , Goal Oriented , Alert    Mood/Affect:  Comfortable , Calm , Euthymic , Interested    CSW Assessment: CSW met with MOB and her parents in MOB's third floor room/320 to offer support and complete assessment due to hx of Depression and LPNC.  CSW notes that PNC was initiated at 28 weeks.   MOB was pleasant and welcoming and stated we could talk openly with her parents present.  MGM added, "I'm the one who told her she was pregnant."   MOB states she and baby are doing well.  Bonding is evident in the way she held and breast fed infant while CSW spoke with her.  Parents appear very involved and supportive.   They state they have everything they need for infant at home and were attentive to information given on SIDS by CSW.  MOB reports that FOB is involved and supportive.   MOB denies hx of Depression other than during the beginning of her Freshman year while adjusting to college life at [employer].  She states she decided to return home and go to [employer] to get her Associates Degree before returning to [employer].  She denies concerns with emotions at this time and was engaged and attentive to information given regarding PMADs.  CSW encouraged MOB to talk with a medical professional if she has concerns about her emotional wellbeing at any time.  MOB agreed.  MOB's parents stated commitment to watch for signs and symptoms as well.   MOB reports that she did not realize she was pregnant until she was 6 months along.  She states it was a shock and that she did not have time to process it because she had to return to work and school immediately.  She was working at [employer] at that time.  She states she came to accept the pregnancy and is now very happy about becoming a mother.  CSW informed MOB of hospital drug screen policy due to LPNC.  MOB stated no concerns and denies substance use.  CSW will monitor results.  CSW Plan/Description:  No Further Intervention Required/No Barriers to Discharge, Patient/Family Education , Information/Referral   to Community Resources     Elimelech Houseman Elizabeth, LCSW 08/14/2016, 10:26 PM 

## 2016-08-14 NOTE — Discharge Instructions (Addendum)
Postpartum Depression and Baby Blues °The postpartum period begins right after the birth of a baby. During this time, there is often a great amount of joy and excitement. It is also a time of many changes in the life of the parents. Regardless of how many times a mother gives birth, each child brings new challenges and dynamics to the family. It is not unusual to have feelings of excitement along with confusing shifts in moods, emotions, and thoughts. All mothers are at risk of developing postpartum depression or the "baby blues." These mood changes can occur right after giving birth, or they may occur many months after giving birth. The baby blues or postpartum depression can be mild or severe. Additionally, postpartum depression can go away rather quickly, or it can be a long-term condition. °What are the causes? °Raised hormone levels and the rapid drop in those levels are thought to be a main cause of postpartum depression and the baby blues. A number of hormones change during and after pregnancy. Estrogen and progesterone usually decrease right after the delivery of your baby. The levels of thyroid hormone and various cortisol steroids also rapidly drop. Other factors that play a role in these mood changes include major life events and genetics. °What increases the risk? °If you have any of the following risks for the baby blues or postpartum depression, know what symptoms to watch out for during the postpartum period. Risk factors that may increase the likelihood of getting the baby blues or postpartum depression include: °· Having a personal or family history of depression. °· Having depression while being pregnant. °· Having premenstrual mood issues or mood issues related to oral contraceptives. °· Having a lot of life stress. °· Having marital conflict. °· Lacking a social support network. °· Having a baby with special needs. °· Having health problems, such as diabetes. °What are the signs or  symptoms? °Symptoms of baby blues include: °· Brief changes in mood, such as going from extreme happiness to sadness. °· Decreased concentration. °· Difficulty sleeping. °· Crying spells, tearfulness. °· Irritability. °· Anxiety. °Symptoms of postpartum depression typically begin within the first month after giving birth. These symptoms include: °· Difficulty sleeping or excessive sleepiness. °· Marked weight loss. °· Agitation. °· Feelings of worthlessness. °· Lack of interest in activity or food. °Postpartum psychosis is a very serious condition and can be dangerous. Fortunately, it is rare. Displaying any of the following symptoms is cause for immediate medical attention. Symptoms of postpartum psychosis include: °· Hallucinations and delusions. °· Bizarre or disorganized behavior. °· Confusion or disorientation. °How is this diagnosed? °A diagnosis is made by an evaluation of your symptoms. There are no medical or lab tests that lead to a diagnosis, but there are various questionnaires that a health care provider may use to identify those with the baby blues, postpartum depression, or psychosis. Often, a screening tool called the Edinburgh Postnatal Depression Scale is used to diagnose depression in the postpartum period. °How is this treated? °The baby blues usually goes away on its own in 1-2 weeks. Social support is often all that is needed. You will be encouraged to get adequate sleep and rest. Occasionally, you may be given medicines to help you sleep. °Postpartum depression requires treatment because it can last several months or longer if it is not treated. Treatment may include individual or group therapy, medicine, or both to address any social, physiological, and psychological factors that may play a role in the depression. Regular exercise, a   healthy diet, rest, and social support may also be strongly recommended. Postpartum psychosis is more serious and needs treatment right away. Hospitalization is  often needed. Follow these instructions at home:  Get as much rest as you can. Nap when the baby sleeps.  Exercise regularly. Some women find yoga and walking to be beneficial.  Eat a balanced and nourishing diet.  Do little things that you enjoy. Have a cup of tea, take a bubble bath, read your favorite magazine, or listen to your favorite music.  Avoid alcohol.  Ask for help with household chores, cooking, grocery shopping, or running errands as needed. Do not try to do everything.  Talk to people close to you about how you are feeling. Get support from your partner, family members, friends, or other new moms.  Try to stay positive in how you think. Think about the things you are grateful for.  Do not spend a lot of time alone.  Only take over-the-counter or prescription medicine as directed by your health care provider.  Keep all your postpartum appointments.  Let your health care provider know if you have any concerns. Contact a health care provider if: You are having a reaction to or problems with your medicine. Get help right away if:  You have suicidal feelings.  You think you may harm the baby or someone else. This information is not intended to replace advice given to you by your health care provider. Make sure you discuss any questions you have with your health care provider. Document Released: 12/31/2003 Document Revised: 09/03/2015 Document Reviewed: 01/07/2013 Elsevier Interactive Patient Education  2017 Elsevier Inc.   Preeclampsia and Eclampsia Preeclampsia is a serious condition that develops only during pregnancy. It is also called toxemia of pregnancy. This condition causes high blood pressure along with other symptoms, such as swelling and headaches. These symptoms may develop as the condition gets worse. Preeclampsia may occur at 20 weeks of pregnancy or later. Diagnosing and treating preeclampsia early is very important. If not treated early, it can cause  serious problems for you and your baby. One problem it can lead to is eclampsia, which is a condition that causes muscle jerking or shaking (convulsions or seizures) in the mother. Delivering your baby is the best treatment for preeclampsia or eclampsia. Preeclampsia and eclampsia symptoms usually go away after your baby is born. What are the causes? The cause of preeclampsia is not known. What increases the risk? The following risk factors make you more likely to develop preeclampsia:  Being pregnant for the first time.  Having had preeclampsia during a past pregnancy.  Having a family history of preeclampsia.  Having high blood pressure.  Being pregnant with twins or triplets.  Being 38 or older.  Being African-American.  Having kidney disease or diabetes.  Having medical conditions such as lupus or blood diseases.  Being very overweight (obese). What are the signs or symptoms? The earliest signs of preeclampsia are:  High blood pressure.  Increased protein in your urine. Your health care provider will check for this at every visit before you give birth (prenatal visit). Other symptoms that may develop as the condition gets worse include:  Severe headaches.  Sudden weight gain.  Swelling of the hands, face, legs, and feet.  Nausea and vomiting.  Vision problems, such as blurred or double vision.  Numbness in the face, arms, legs, and feet.  Urinating less than usual.  Dizziness.  Slurred speech.  Abdominal pain, especially upper abdominal pain.  Convulsions or seizures. Symptoms generally go away after giving birth. How is this diagnosed? There are no screening tests for preeclampsia. Your health care provider will ask you about symptoms and check for signs of preeclampsia during your prenatal visits. You may also have tests that include:  Urine tests.  Blood tests.  Checking your blood pressure.  Monitoring your babys heart rate.  Ultrasound. How  is this treated? You and your health care provider will determine the treatment approach that is best for you. Treatment may include:  Having more frequent prenatal exams to check for signs of preeclampsia, if you have an increased risk for preeclampsia.  Bed rest.  Reducing how much salt (sodium) you eat.  Medicine to lower your blood pressure.  Staying in the hospital, if your condition is severe. There, treatment will focus on controlling your blood pressure and the amount of fluids in your body (fluid retention).  You may need to take medicine (magnesium sulfate) to prevent seizures. This medicine may be given as an injection or through an IV tube.  Delivering your baby early, if your condition gets worse. You may have your labor started with medicine (induced), or you may have a cesarean delivery. Follow these instructions at home: Eating and drinking    Drink enough fluid to keep your urine clear or pale yellow.  Eat a healthy diet that is low in sodium. Do not add salt to your food. Check nutrition labels to see how much sodium a food or beverage contains.  Avoid caffeine. Lifestyle   Do not use any products that contain nicotine or tobacco, such as cigarettes and e-cigarettes. If you need help quitting, ask your health care provider.  Do not use alcohol or drugs.  Avoid stress as much as possible. Rest and get plenty of sleep. General instructions   Take over-the-counter and prescription medicines only as told by your health care provider.  When lying down, lie on your side. This keeps pressure off of your baby.  When sitting or lying down, raise (elevate) your feet. Try putting some pillows underneath your lower legs.  Exercise regularly. Ask your health care provider what kinds of exercise are best for you.  Keep all follow-up and prenatal visits as told by your health care provider. This is important. How is this prevented? To prevent preeclampsia or eclampsia  from developing during another pregnancy:  Get proper medical care during pregnancy. Your health care provider may be able to prevent preeclampsia or diagnose and treat it early.  Your health care provider may have you take a low-dose aspirin or a calcium supplement during your next pregnancy.  You may have tests of your blood pressure and kidney function after giving birth.  Maintain a healthy weight. Ask your health care provider for help managing weight gain during pregnancy.  Work with your health care provider to manage any long-term (chronic) health conditions you have, such as diabetes or kidney problems. Contact a health care provider if:  You gain more weight than expected.  You have headaches.  You have nausea or vomiting.  You have abdominal pain.  You feel dizzy or light-headed. Get help right away if:  You develop sudden or severe swelling anywhere in your body. This usually happens in the legs.  You gain 5 lbs (2.3 kg) or more during one week.  You have severe:  Abdominal pain.  Headaches.  Dizziness.  Vision problems.  Confusion.  Nausea or vomiting.  You have a seizure.  You have trouble moving any part of your body.  You develop numbness in any part of your body.  You have trouble speaking.  You have any abnormal bleeding.  You pass out. This information is not intended to replace advice given to you by your health care provider. Make sure you discuss any questions you have with your health care provider. Document Released: 03/25/2000 Document Revised: 11/24/2015 Document Reviewed: 11/02/2015 Elsevier Interactive Patient Education  2017 Elsevier Inc.  Vaginal Delivery, Care After Refer to this sheet in the next few weeks. These instructions provide you with information about caring for yourself after vaginal delivery. Your health care provider may also give you more specific instructions. Your treatment has been planned according to current  medical practices, but problems sometimes occur. Call your health care provider if you have any problems or questions. What can I expect after the procedure? After vaginal delivery, it is common to have:  Some bleeding from your vagina.  Soreness in your abdomen, your vagina, and the area of skin between your vaginal opening and your anus (perineum).  Pelvic cramps.  Fatigue. Follow these instructions at home: Medicines   Take over-the-counter and prescription medicines only as told by your health care provider.  If you were prescribed an antibiotic medicine, take it as told by your health care provider. Do not stop taking the antibiotic until it is finished. Driving    Do not drive or operate heavy machinery while taking prescription pain medicine.  Do not drive for 24 hours if you received a sedative. Lifestyle   Do not drink alcohol. This is especially important if you are breastfeeding or taking medicine to relieve pain.  Do not use tobacco products, including cigarettes, chewing tobacco, or e-cigarettes. If you need help quitting, ask your health care provider. Eating and drinking   Drink at least 8 eight-ounce glasses of water every day unless you are told not to by your health care provider. If you choose to breastfeed your baby, you may need to drink more water than this.  Eat high-fiber foods every day. These foods may help prevent or relieve constipation. High-fiber foods include:  Whole grain cereals and breads.  Brown rice.  Beans.  Fresh fruits and vegetables. Activity   Return to your normal activities as told by your health care provider. Ask your health care provider what activities are safe for you.  Rest as much as possible. Try to rest or take a nap when your baby is sleeping.  Do not lift anything that is heavier than your baby or 10 lb (4.5 kg) until your health care provider says that it is safe.  Talk with your health care provider about when  you can engage in sexual activity. This may depend on your:  Risk of infection.  Rate of healing.  Comfort and desire to engage in sexual activity. Vaginal Care   If you have an episiotomy or a vaginal tear, check the area every day for signs of infection. Check for:  More redness, swelling, or pain.  More fluid or blood.  Warmth.  Pus or a bad smell.  Do not use tampons or douches until your health care provider says this is safe.  Watch for any blood clots that may pass from your vagina. These may look like clumps of dark red, brown, or black discharge. General instructions   Keep your perineum clean and dry as told by your health care provider.  Wear loose, comfortable clothing.  Wipe  from front to back when you use the toilet.  Ask your health care provider if you can shower or take a bath. If you had an episiotomy or a perineal tear during labor and delivery, your health care provider may tell you not to take baths for a certain length of time.  Wear a bra that supports your breasts and fits you well.  If possible, have someone help you with household activities and help care for your baby for at least a few days after you leave the hospital.  Keep all follow-up visits for you and your baby as told by your health care provider. This is important. Contact a health care provider if:  You have:  Vaginal discharge that has a bad smell.  Difficulty urinating.  Pain when urinating.  A sudden increase or decrease in the frequency of your bowel movements.  More redness, swelling, or pain around your episiotomy or vaginal tear.  More fluid or blood coming from your episiotomy or vaginal tear.  Pus or a bad smell coming from your episiotomy or vaginal tear.  A fever.  A rash.  Little or no interest in activities you used to enjoy.  Questions about caring for yourself or your baby.  Your episiotomy or vaginal tear feels warm to the touch.  Your episiotomy or  vaginal tear is separating or does not appear to be healing.  Your breasts are painful, hard, or turn red.  You feel unusually sad or worried.  You feel nauseous or you vomit.  You pass large blood clots from your vagina. If you pass a blood clot from your vagina, save it to show to your health care provider. Do not flush blood clots down the toilet without having your health care provider look at them.  You urinate more than usual.  You are dizzy or light-headed.  You have not breastfed at all and you have not had a menstrual period for 12 weeks after delivery.  You have stopped breastfeeding and you have not had a menstrual period for 12 weeks after you stopped breastfeeding. Get help right away if:  You have:  Pain that does not go away or does not get better with medicine.  Chest pain.  Difficulty breathing.  Blurred vision or spots in your vision.  Thoughts about hurting yourself or your baby.  You develop pain in your abdomen or in one of your legs.  You develop a severe headache.  You faint.  You bleed from your vagina so much that you fill two sanitary pads in one hour. This information is not intended to replace advice given to you by your health care provider. Make sure you discuss any questions you have with your health care provider. Document Released: 03/25/2000 Document Revised: 09/09/2015 Document Reviewed: 04/12/2015 Elsevier Interactive Patient Education  2017 [employer].

## 2016-08-15 NOTE — Anesthesia Postprocedure Evaluation (Signed)
Anesthesia Post Note  Patient: Selena Walsh  Procedure(s) Performed: * No procedures listed *  Patient location during evaluation: Mother Baby Anesthesia Type: Epidural Level of consciousness: awake and alert Pain management: pain level controlled Vital Signs Assessment: post-procedure vital signs reviewed and stable Respiratory status: spontaneous breathing, nonlabored ventilation and respiratory function stable Cardiovascular status: stable Postop Assessment: no headache, no backache and epidural receding Anesthetic complications: no       Last Vitals: There were no vitals filed for this visit.  Last Pain: There were no vitals filed for this visit.               Phillips Groutarignan, Diera Wirkkala

## 2016-08-19 ENCOUNTER — Ambulatory Visit: Payer: Self-pay

## 2016-08-19 NOTE — Lactation Note (Signed)
This note was copied from a baby's chart. Lactation Consult  Mother's reason for visit: 9 % weight loss after discharge Visit Type:  Initial outpatient Appointment Notes:  See below Consult:  Initial Lactation Consultant:  Judee Clara  ________________________________________________________________________ Selena Walsh Name:  Selena Walsh Date of Birth:  08/12/2017 Pediatrician:  [facility] Gender:  female Gestational Age: [redacted]w[redacted]d (At Birth) Birth Weight:  7 lb 4.6 oz (3305 g) Weight at Discharge:  6 lbs 9 oz                        Date of Discharge: 08/14/16  There were no vitals filed for this visit. Last weight taken from location outside of [facility]: 7 lbs 1 oz at Pediatrician    Location:Pediatrician's office Weight today:  7 lbs 2.7 oz   Mom in today for reassurance after difficulty with latching, and weight loss.  Mom states that breasts started feeling more full just yesterday, day 6.  Today, Mom latches baby with ease in football hold on both breasts.  Baby fed 10 minutes on right breast transferring 40 ml, and 15 mins on left breast transferring 50 ml. Encouraged continued feeding baby often on cue, with a goal of 8-12 feedings per 24 hrs.  To call Lactation for any questions.  Encouraged Mom to attend a Breastfeeding Support Group on Tuesday at 11 am.  Mom very encouraged and happy with how baby is doing.  No questions.  Baby's Name: Selena Walsh Type of delivery:  Vaginal, Spontaneous Delivery Breastfeeding Experience: first baby Maternal Medical Conditions:  Pregnancy induced hypertension Maternal Medications:  Motrin, PNV  ________________________________________________________________________  Breastfeeding History (Post Discharge)  Frequency of breastfeeding:  8-12 times per 24 hrs Duration of feeding:  20-30 mins   Infant Intake and Output Assessment  Voids:  4-5 in 24 hrs.  Color:  Clear yellow Stools:  2  in  24 hrs.  Color:  Brown and Yellow  ________________________________________________________________________  Maternal Breast Assessment  Breast:  Soft Nipple:  Erect Pain level:  0  _______________________________________________________________________ Feeding Assessment/Evaluation  Initial feeding assessment:  Infant's oral assessment:  WNL  Positioning:  Football Right breast  LATCH documentation:  Latch:  2 = Grasps breast easily, tongue down, lips flanged, rhythmical sucking.  Audible swallowing:  2 = Spontaneous and intermittent  Type of nipple:  2 = Everted at rest and after stimulation  Comfort (Breast/Nipple):  2 = Soft / non-tender  Hold (Positioning):  2 = No assistance needed to correctly position infant at breast  LATCH score:  10  Attached assessment:  Deep  Lips flanged:  Yes.    Lips untucked:  Yes.    Suck assessment:  Nutritive   Pre-feed weight:  3252 g   Post-feed weight:  3292 g Amount transferred:  40 ml  Additional Feeding Assessment -   Positioning:  Football Left breast  LATCH documentation:  Latch:  2 = Grasps breast easily, tongue down, lips flanged, rhythmical sucking.  Audible swallowing:  2 = Spontaneous and intermittent  Type of nipple:  2 = Everted at rest and after stimulation  Comfort (Breast/Nipple):  2 = Soft / non-tender  Hold (Positioning):  2 = No assistance needed to correctly position infant at breast  LATCH score:  10  Attached assessment:  Deep  Lips flanged:  Yes.    Lips untucked:  Yes.    Suck assessment:  Nutritive   Pre-feed  weight:  3292 g  Post-feed weight:   3342g  Amount transferred: 50 ml    Total amount transferred:  90 ml
